# Patient Record
Sex: Female | Born: 1965 | Race: White | Hispanic: No | Marital: Married | Smoking: Never smoker
Health system: Southern US, Community
[De-identification: ages and names within clinical notes are randomized; demographics above are authoritative.]

## PROBLEM LIST (undated history)

## (undated) DIAGNOSIS — Z87898 Personal history of other specified conditions: Secondary | ICD-10-CM

## (undated) DIAGNOSIS — E079 Disorder of thyroid, unspecified: Secondary | ICD-10-CM

## (undated) DIAGNOSIS — Z9289 Personal history of other medical treatment: Secondary | ICD-10-CM

## (undated) HISTORY — PX: WISDOM TOOTH EXTRACTION: SHX21

## (undated) HISTORY — DX: Personal history of other medical treatment: Z92.89

## (undated) HISTORY — DX: Personal history of other specified conditions: Z87.898

## (undated) HISTORY — DX: Disorder of thyroid, unspecified: E07.9

## (undated) HISTORY — PX: COLONOSCOPY: SHX174

---

## 1993-09-22 HISTORY — PX: DIAGNOSTIC LAPAROSCOPY: SUR761

## 2006-06-21 ENCOUNTER — Inpatient Hospital Stay: Payer: Self-pay | Admitting: Obstetrics and Gynecology

## 2016-03-07 ENCOUNTER — Other Ambulatory Visit: Payer: Self-pay | Admitting: Certified Nurse Midwife

## 2016-03-07 DIAGNOSIS — Z1231 Encounter for screening mammogram for malignant neoplasm of breast: Secondary | ICD-10-CM

## 2016-03-26 ENCOUNTER — Ambulatory Visit: Payer: Self-pay

## 2016-06-12 ENCOUNTER — Ambulatory Visit: Payer: Self-pay

## 2016-06-13 ENCOUNTER — Ambulatory Visit
Admission: RE | Admit: 2016-06-13 | Discharge: 2016-06-13 | Disposition: A | Discharge status: Home / Self Care | Payer: BLUE CROSS/BLUE SHIELD | Source: Ambulatory Visit | Attending: Certified Nurse Midwife | Admitting: Certified Nurse Midwife

## 2016-06-13 ENCOUNTER — Encounter (HOSPITAL_COMMUNITY): Payer: Self-pay

## 2016-06-13 DIAGNOSIS — Z1231 Encounter for screening mammogram for malignant neoplasm of breast: Secondary | ICD-10-CM | POA: Diagnosis present

## 2017-03-26 ENCOUNTER — Encounter: Payer: Self-pay | Admitting: Certified Nurse Midwife

## 2017-03-26 ENCOUNTER — Ambulatory Visit (INDEPENDENT_AMBULATORY_CARE_PROVIDER_SITE_OTHER): Payer: BLUE CROSS/BLUE SHIELD | Admitting: Certified Nurse Midwife

## 2017-03-26 VITALS — BP 90/50 | HR 51 | Ht 61.0 in | Wt 128.0 lb

## 2017-03-26 DIAGNOSIS — Z1239 Encounter for other screening for malignant neoplasm of breast: Secondary | ICD-10-CM

## 2017-03-26 DIAGNOSIS — Z01419 Encounter for gynecological examination (general) (routine) without abnormal findings: Secondary | ICD-10-CM | POA: Diagnosis not present

## 2017-03-26 DIAGNOSIS — Z1231 Encounter for screening mammogram for malignant neoplasm of breast: Secondary | ICD-10-CM | POA: Diagnosis not present

## 2017-03-26 DIAGNOSIS — E039 Hypothyroidism, unspecified: Secondary | ICD-10-CM | POA: Insufficient documentation

## 2017-03-26 NOTE — Progress Notes (Signed)
Gynecology Annual Exam  PCP: Patient, No Pcp Per  Chief Complaint:  Chief Complaint  Patient presents with  . Gynecologic Exam    History of Present Illness:Bonnie Anderson is a 51 year old Caucasian/White female, G3 P3003, who presents for her annual exam. She is having no significant GYN problems.  Her menses are absent since June 2014. Has no current problems with vaginal dryness.  She has had no spotting.   The patient's past medical history is notable for a history of hypothyroidism and is currently taking 141mcg/day. She is followed by DR Ouida Sills.  Since her last annual GYN exam dated 03/20/2016, she has had no significant changes in her health history.  She is sexually active. She is currently using condoms for contraception. Was advised she no longer needs to use contraception.  Her most recent pap smear was obtained 03/20/2016 and was  negative   Her most recent mammogram obtained on 06/13/2016 was normal.  She has an appointment July 2017 with GI regarding colonoscopy. There is no family history of breast cancer.  There is no family history of ovarian cancer.  The patient does do monthly self breast exams.  The patient does not smoke.  The patient does drink infrequently.  The patient does not use illegal drugs.  The patient exercises regularly.  The patient does get adequate calcium in her diet. She is also taking vitamin D supplements. Her Dexa scan in 2015 she reports as being normal.  She had a recent cholesterol screen in 2018 that was normal.      The patient denies current symptoms of depression.    Review of Systems: Review of Systems  Constitutional: Negative for chills, fever and weight loss.  HENT: Negative for congestion, sinus pain and sore throat.   Eyes: Negative for blurred vision and pain.  Respiratory: Negative for hemoptysis, shortness of breath and wheezing.   Cardiovascular: Negative for chest pain, palpitations and leg swelling.    Gastrointestinal: Negative for abdominal pain, blood in stool, diarrhea, heartburn, nausea and vomiting.  Genitourinary: Negative for dysuria, frequency, hematuria and urgency.  Musculoskeletal: Negative for back pain, joint pain and myalgias.  Skin: Negative for itching and rash.  Neurological: Negative for dizziness, tingling and headaches.  Endo/Heme/Allergies: Negative for environmental allergies and polydipsia. Does not bruise/bleed easily.       Negative for hirsutism   Psychiatric/Behavioral: Negative for depression. The patient is not nervous/anxious and does not have insomnia.     Past Medical History:  Past Medical History:  Diagnosis Date  . Thyroid disease     Past Surgical History:  Past Surgical History:  Procedure Laterality Date  . DIAGNOSTIC LAPAROSCOPY     [redacted] weeks gestation with adnexal mass/ MAD/ uterine leiomyoma  . WISDOM TOOTH EXTRACTION  age 44   one tooth    Family History:  Family History  Problem Relation Age of Onset  . Thyroid disease Mother        hypothyroidism  . Thyroid disease Sister        hypothyroidism  . Breast cancer Neg Hx   . Diabetes Neg Hx   . Heart failure Neg Hx     Social History:  Social History   Social History  . Marital status: Married    Spouse name: N/A  . Number of children: 3  . Years of education: N/A   Occupational History  . homemaker    Social History Main Topics  . Smoking status:  Never Smoker  . Smokeless tobacco: Never Used  . Alcohol use No  . Drug use: No  . Sexual activity: Yes    Partners: Male    Birth control/ protection: Post-menopausal   Other Topics Concern  . Not on file   Social History Narrative  . No narrative on file    Allergies:  No Known Allergies  Medications:  Current Outpatient Prescriptions:  .  aspirin 81 MG chewable tablet, Chew 81 mg by mouth daily., Disp: , Rfl:  .  cholecalciferol (VITAMIN D) 400 units TABS tablet, Take 400 Units by mouth daily., Disp: ,  Rfl:  .  fluticasone (FLONASE) 50 MCG/ACT nasal spray, Place 2 sprays into both nostrils daily as needed for allergies or rhinitis., Disp: , Rfl:  .  levothyroxine (SYNTHROID, LEVOTHROID) 75 MCG tablet, Take by mouth., Disp: , Rfl:  .  Multiple Vitamin (MULTIVITAMIN) tablet, Take 1 tablet by mouth daily., Disp: , Rfl:  .  Omega 3 1000 MG CAPS, Take 1 capsule by mouth daily., Disp: , Rfl:   Physical Exam Vitals: BP (!) 90/50   Pulse (!) 51   Ht 5\' 1"  (1.549 m)   Wt 128 lb (58.1 kg)   BMI 24.19 kg/m   General:WF in  NAD HEENT: normocephalic, anicteric Neck: no thyroid enlargement, no palpable nodules, no cervical lymphadenopathy  Pulmonary: No increased work of breathing, CTAB Cardiovascular: RRR, without murmur  Breast: Breast symmetrical, no tenderness, no palpable nodules or masses, no skin or nipple retraction present, no nipple discharge.  No axillary, infraclavicular or supraclavicular lymphadenopathy. Abdomen: Soft, non-tender, non-distended.  Umbilicus without lesions.  No hepatomegaly or masses palpable. No evidence of hernia. Genitourinary:  External: Normal external female genitalia.  Normal urethral meatus, normal Bartholin's and Skene's glands.    Vagina: Normal vaginal mucosa, cystocele present    Cervix: Grossly normal in appearance, no bleeding, non-tender  Uterus: Retroflexed, normal size, shape, and consistency, mobile, and non-tender  Adnexa: No adnexal masses, non-tender  Rectal: deferred  Lymphatic: no evidence of inguinal lymphadenopathy Extremities: no edema, erythema, or tenderness Neurologic: Grossly intact Psychiatric: mood appropriate, affect full     Assessment: 51 y.o. [id number] well woman exam  Plan:   1) Breast cancer screening - recommend monthly self breast exam and annual mammograms Mammogram was ordered today. Patient to schedule after 22 Sept 2) Cervical cancer screening - Pap smear due in 2 years. ASCCP guidelines and rational discussed.   Patient opts for every 3 years screening interval 3) Routine healthcare maintenance including cholesterol and diabetes screening managed by PCP  4) Colon cancer screening- colonoscopy to be done this year. Has appt with GI to schedule  Dalia Heading, CNM

## 2017-06-25 HISTORY — PX: COLONOSCOPY W/ BIOPSIES AND POLYPECTOMY: SHX1376

## 2017-07-01 ENCOUNTER — Ambulatory Visit
Admission: RE | Admit: 2017-07-01 | Discharge: 2017-07-01 | Disposition: A | Discharge status: Home / Self Care | Payer: BLUE CROSS/BLUE SHIELD | Source: Ambulatory Visit | Attending: Certified Nurse Midwife | Admitting: Certified Nurse Midwife

## 2017-07-01 DIAGNOSIS — Z1231 Encounter for screening mammogram for malignant neoplasm of breast: Secondary | ICD-10-CM | POA: Insufficient documentation

## 2017-07-01 DIAGNOSIS — Z1239 Encounter for other screening for malignant neoplasm of breast: Secondary | ICD-10-CM

## 2018-04-19 ENCOUNTER — Encounter: Payer: Self-pay | Admitting: Certified Nurse Midwife

## 2018-04-19 ENCOUNTER — Ambulatory Visit (INDEPENDENT_AMBULATORY_CARE_PROVIDER_SITE_OTHER): Payer: BLUE CROSS/BLUE SHIELD | Admitting: Certified Nurse Midwife

## 2018-04-19 VITALS — BP 102/62 | HR 55 | Ht 61.0 in | Wt 142.0 lb

## 2018-04-19 DIAGNOSIS — Z1231 Encounter for screening mammogram for malignant neoplasm of breast: Secondary | ICD-10-CM | POA: Diagnosis not present

## 2018-04-19 DIAGNOSIS — Z01419 Encounter for gynecological examination (general) (routine) without abnormal findings: Secondary | ICD-10-CM

## 2018-04-19 DIAGNOSIS — Z1239 Encounter for other screening for malignant neoplasm of breast: Secondary | ICD-10-CM

## 2018-04-19 NOTE — Progress Notes (Signed)
Gynecology Annual Exam  PCP: Patient, No Pcp Per  Chief Complaint:  Chief Complaint  Patient presents with  . Gynecologic Exam    History of Present Illness:Bonnie Anderson is a 52 year old Caucasian/White female, G3 P3003, who presents for her annual exam. She is having no significant GYN problems.  Her menses are absent since June 2014. Has no current problems with vaginal dryness.  She has had no spotting.   The patient's past medical history is notable for a history of hypothyroidism and is currently taking 75 mcg/day. She is followed by DR Ouida Sills.  Since her last annual GYN exam dated 04/05/2017, she has had no significant changes in her health history.  She is sexually active. She is currently using condoms for contraception. She is aware she no longer needs contraception. Her most recent pap smear was obtained 03/20/2016 and was  Negative. She desires Pap smears every 3 years  Her most recent mammogram obtained on 07/01/2017 was normal.  She had a colonoscopy 06/25/2017 and had an adenomatous polyp removed. Repeat in 06/2018 to make sure all of the polyp was removed.. There is no family history of breast cancer.  There is no family history of ovarian cancer.  The patient does do monthly self breast exams.  The patient does not smoke.  The patient does drink infrequently.  The patient does not use illegal drugs.  The patient exercises regularly but has gained 14# since her last visit. Has been eating more CHO recently. The patient does get adequate calcium in her diet and with her supplement. Her Dexa scan in 2015 she reports as being normal.  She had a recent cholesterol screen in 2019 that was normal.      The patient denies current symptoms of depression.    Review of Systems: Review of Systems  Constitutional: Negative for chills, fever and weight loss.  HENT: Negative for congestion, sinus pain and sore throat.   Eyes: Negative for blurred vision and pain.    Respiratory: Negative for hemoptysis, shortness of breath and wheezing.   Cardiovascular: Negative for chest pain, palpitations and leg swelling.  Gastrointestinal: Negative for abdominal pain, blood in stool, diarrhea, heartburn, nausea and vomiting.  Genitourinary: Negative for dysuria, frequency, hematuria and urgency.  Musculoskeletal: Negative for back pain, joint pain and myalgias.  Skin: Negative for itching and rash.  Neurological: Negative for dizziness, tingling and headaches.  Endo/Heme/Allergies: Negative for environmental allergies and polydipsia. Does not bruise/bleed easily.       Negative for hirsutism   Psychiatric/Behavioral: Negative for depression. The patient is not nervous/anxious and does not have insomnia.     Past Medical History:  Past Medical History:  Diagnosis Date  . History of abnormal mammogram 02/01/14; 06/13/16   NEG  . History of Papanicolaou smear of cervix 02/01/14; 03/20/16   -/-; NEG  . Thyroid disease    hypothyroidism; DR. MARK ANDERSON    Past Surgical History:  Past Surgical History:  Procedure Laterality Date  . DIAGNOSTIC LAPAROSCOPY     [redacted] weeks gestation with adnexal mass/ MAD/ uterine leiomyoma  . WISDOM TOOTH EXTRACTION  age 3   one tooth    Family History:  Family History  Problem Relation Age of Onset  . Thyroid disease Mother        hypothyroidism  . Thyroid disease Sister        hypothyroidism  . Breast cancer Neg Hx   . Diabetes Neg Hx   .  Heart failure Neg Hx     Social History:  Social History   Socioeconomic History  . Marital status: Married    Spouse name: Wes  . Number of children: 3  . Years of education: 40  . Highest education level: Not on file  Occupational History  . Occupation: homemaker  Social Needs  . Financial resource strain: Not on file  . Food insecurity:    Worry: Not on file    Inability: Not on file  . Transportation needs:    Medical: Not on file    Non-medical: Not on file   Tobacco Use  . Smoking status: Never Smoker  . Smokeless tobacco: Never Used  Substance and Sexual Activity  . Alcohol use: Yes  . Drug use: No  . Sexual activity: Yes    Partners: Male    Birth control/protection: Post-menopausal  Lifestyle  . Physical activity:    Days per week: Not on file    Minutes per session: Not on file  . Stress: Not on file  Relationships  . Social connections:    Talks on phone: Not on file    Gets together: Not on file    Attends religious service: Not on file    Active member of club or organization: Not on file    Attends meetings of clubs or organizations: Not on file    Relationship status: Not on file  . Intimate partner violence:    Fear of current or ex partner: Not on file    Emotionally abused: Not on file    Physically abused: Not on file    Forced sexual activity: Not on file  Other Topics Concern  . Not on file  Social History Narrative  . Not on file    Allergies:  No Known Allergies  Medications:  Current Outpatient Medications on File Prior to Visit  Medication Sig Dispense Refill  . calcium-vitamin D (OSCAL WITH D) 500-200 MG-UNIT TABS tablet Take by mouth.    . fluticasone (FLONASE) 50 MCG/ACT nasal spray Place 2 sprays into both nostrils daily as needed for allergies or rhinitis.    Marland Kitchen levothyroxine (SYNTHROID, LEVOTHROID) 75 MCG tablet Take 75 mcg by mouth daily before breakfast.     .       No current facility-administered medications on file prior to visit.   And Omega 3 capsules occasionally    Physical Exam Vitals: BP 102/62   Pulse (!) 55   Ht 5\' 1"  (1.549 m)   Wt 142 lb (64.4 kg)   BMI 26.83 kg/m   General:WF in  NAD HEENT: normocephalic, anicteric Neck: no thyroid enlargement, no palpable nodules, no cervical lymphadenopathy  Pulmonary: No increased work of breathing, CTAB Cardiovascular: RRR, without murmur  Breast: Breast symmetrical, no tenderness, no palpable nodules or masses, no skin or nipple  retraction present, no nipple discharge.  No axillary, infraclavicular or supraclavicular lymphadenopathy. Abdomen: Soft, non-tender, non-distended.  Umbilicus without lesions.  No hepatomegaly or masses palpable. No evidence of hernia. Genitourinary:  External: Normal external female genitalia.  Normal urethral meatus, normal Bartholin's and Skene's glands.    Vagina: Normal vaginal mucosa, cystocele present    Cervix: Grossly normal in appearance, no bleeding, non-tender  Uterus: Retroflexed, normal size, shape, and consistency, mobile, and non-tender  Adnexa: No adnexal masses, non-tender  Rectal: deferred  Lymphatic: no evidence of inguinal lymphadenopathy Extremities: no edema, erythema, or tenderness Neurologic: Grossly intact Psychiatric: mood appropriate, affect full     Assessment:  52 y.o. [id number] well woman exam  Plan:   1) Breast cancer screening - recommend monthly self breast exam and annual mammograms Mammogram was ordered today. Patient to schedule after 07/01/18 2) Cervical cancer screening - Pap smear due in 1 year. ASCCP guidelines and rational discussed.  Patient opts for every 3 years screening interval 3) Routine healthcare maintenance including cholesterol and diabetes screening managed by PCP  4) Colon cancer screening- follow up colonoscopy to be done this year. GI to schedule in October 5) RTO 1 year and prn  Dalia Heading, CNM

## 2018-07-05 ENCOUNTER — Ambulatory Visit
Admission: RE | Admit: 2018-07-05 | Discharge: 2018-07-05 | Disposition: A | Discharge status: Home / Self Care | Payer: BLUE CROSS/BLUE SHIELD | Source: Ambulatory Visit | Attending: Certified Nurse Midwife | Admitting: Certified Nurse Midwife

## 2018-07-05 DIAGNOSIS — Z1239 Encounter for other screening for malignant neoplasm of breast: Secondary | ICD-10-CM | POA: Diagnosis present

## 2019-04-27 ENCOUNTER — Ambulatory Visit: Payer: BLUE CROSS/BLUE SHIELD | Admitting: Certified Nurse Midwife

## 2019-05-11 ENCOUNTER — Other Ambulatory Visit: Payer: Self-pay

## 2019-05-11 ENCOUNTER — Encounter: Payer: Self-pay | Admitting: Certified Nurse Midwife

## 2019-05-11 ENCOUNTER — Ambulatory Visit (INDEPENDENT_AMBULATORY_CARE_PROVIDER_SITE_OTHER): Payer: BC Managed Care – PPO | Admitting: Certified Nurse Midwife

## 2019-05-11 ENCOUNTER — Other Ambulatory Visit (HOSPITAL_COMMUNITY)
Admission: RE | Admit: 2019-05-11 | Discharge: 2019-05-11 | Disposition: A | Discharge status: Home / Self Care | Payer: BC Managed Care – PPO | Source: Ambulatory Visit | Attending: Certified Nurse Midwife | Admitting: Certified Nurse Midwife

## 2019-05-11 VITALS — BP 100/70 | HR 65 | Ht 61.0 in | Wt 149.2 lb

## 2019-05-11 DIAGNOSIS — Z124 Encounter for screening for malignant neoplasm of cervix: Secondary | ICD-10-CM | POA: Insufficient documentation

## 2019-05-11 DIAGNOSIS — Z1239 Encounter for other screening for malignant neoplasm of breast: Secondary | ICD-10-CM

## 2019-05-11 DIAGNOSIS — Z01419 Encounter for gynecological examination (general) (routine) without abnormal findings: Secondary | ICD-10-CM | POA: Insufficient documentation

## 2019-05-11 DIAGNOSIS — E2839 Other primary ovarian failure: Secondary | ICD-10-CM

## 2019-05-11 DIAGNOSIS — D369 Benign neoplasm, unspecified site: Secondary | ICD-10-CM | POA: Insufficient documentation

## 2019-05-11 DIAGNOSIS — Z13820 Encounter for screening for osteoporosis: Secondary | ICD-10-CM

## 2019-05-11 NOTE — Progress Notes (Signed)
Gynecology Annual Exam  PCP: Kirk Ruths, MD  Chief Complaint:  Chief Complaint  Patient presents with  . Gynecologic Exam    History of Present Illness:Bonnie Anderson is a 53 year old Caucasian/White female, G3 P3003, who presents for her annual exam. She is having no significant GYN problems.  Her menses are absent since June 2014. Has no current problems with vaginal dryness.  She has had no spotting.   The patient's past medical history is notable for a history of hypothyroidism and is currently taking 75 mcg/day. She is followed by DR Ouida Sills.  Since her last annual GYN exam dated 04/19/2018, she had a pinched nerve in her back/spine in March. The pain has resolved with chiropractic treatment. Has gained another 7# since her last annual. Expresses frustration with lack of weight loss.  She is sexually active. She denies any dyspareunia. Her most recent pap smear was obtained 03/20/2016 and was  Negative. She desires Pap smears every 3 years  Her most recent mammogram obtained on 07/05/2018 was normal.  She had a colonoscopy 06/25/2017 and had an adenomatous polyp removed. Repeat colonoscopy 08/12/2018 was normal. Next Pap smear due in 5 years. There is no family history of breast cancer.  There is no family history of ovarian cancer.  The patient does do monthly self breast exams.  The patient does not smoke.  The patient does drink infrequently.  The patient does not use illegal drugs.  The patient normally exercises regularly, but had a pinched nerve causing pain in her right scapular area. Is just getting back to exercising since the problem has resolved. The patient does get adequate calcium in her diet and with her supplement. Her Dexa scan in 2015 she reports as being normal.  She had a recent cholesterol screen in 2020 by PCP that was normal.      The patient denies current symptoms of depression.    Review of Systems: Review of Systems  Constitutional:  Negative for chills, fever and weight loss.  HENT: Negative for congestion, sinus pain and sore throat.   Eyes: Negative for blurred vision and pain.  Respiratory: Negative for hemoptysis, shortness of breath and wheezing.   Cardiovascular: Negative for chest pain, palpitations and leg swelling.  Gastrointestinal: Negative for abdominal pain, blood in stool, diarrhea, heartburn, nausea and vomiting.  Genitourinary: Negative for dysuria, frequency, hematuria and urgency.  Musculoskeletal: Negative for back pain, joint pain and myalgias.  Skin: Negative for itching and rash.  Neurological: Negative for dizziness, tingling and headaches.  Endo/Heme/Allergies: Positive for environmental allergies. Negative for polydipsia. Does not bruise/bleed easily.       Negative for hirsutism   Psychiatric/Behavioral: Negative for depression. The patient is not nervous/anxious and does not have insomnia.     Past Medical History:  Past Medical History:  Diagnosis Date  . History of abnormal mammogram 02/01/14; 06/13/16   NEG  . History of Papanicolaou smear of cervix 02/01/14; 03/20/16   -/-; NEG  . Thyroid disease    hypothyroidism; DR. MARK ANDERSON    Past Surgical History:  Past Surgical History:  Procedure Laterality Date  . COLONOSCOPY  08/13/2019   normal  . COLONOSCOPY W/ BIOPSIES AND POLYPECTOMY  06/25/2017   adenomatous polyp removed  . DIAGNOSTIC LAPAROSCOPY  1995   [redacted] weeks gestation with adnexal mass/ MAD/ uterine leiomyoma  . WISDOM TOOTH EXTRACTION  age 12   one tooth    Family History:  Family History  Problem Relation Age of Onset  . Thyroid disease Mother        hypothyroidism  . Thyroid disease Sister        hypothyroidism  . Breast cancer Neg Hx   . Diabetes Neg Hx   . Heart failure Neg Hx     Social History:  Social History   Socioeconomic History  . Marital status: Married    Spouse name: Wes  . Number of children: 3  . Years of education: 38  . Highest  education level: Not on file  Occupational History  . Occupation: homemaker  Social Needs  . Financial resource strain: Not on file  . Food insecurity    Worry: Not on file    Inability: Not on file  . Transportation needs    Medical: Not on file    Non-medical: Not on file  Tobacco Use  . Smoking status: Never Smoker  . Smokeless tobacco: Never Used  Substance and Sexual Activity  . Alcohol use: Yes  . Drug use: No  . Sexual activity: Yes    Partners: Male    Birth control/protection: Post-menopausal  Lifestyle  . Physical activity    Days per week: Not on file    Minutes per session: Not on file  . Stress: Not on file  Relationships  . Social Herbalist on phone: Not on file    Gets together: Not on file    Attends religious service: Not on file    Active member of club or organization: Not on file    Attends meetings of clubs or organizations: Not on file    Relationship status: Not on file  . Intimate partner violence    Fear of current or ex partner: Not on file    Emotionally abused: Not on file    Physically abused: Not on file    Forced sexual activity: Not on file  Other Topics Concern  . Not on file  Social History Narrative  . Not on file    Allergies:  No Known Allergies  Medications:  Current Outpatient Medications on File Prior to Visit  Medication Sig Dispense Refill  . aspirin EC 81 MG tablet Take 1 tablet by mouth 3 times/day as needed-between meals & bedtime. TWICE A WEEK    . cholecalciferol (VITAMIN D) 400 units TABS tablet Take 400 Units by mouth daily.    Marland Kitchen Fexofenadine-Pseudoephedrine (ALLEGRA-D 24 HOUR PO) Take 1 tablet by mouth daily.    . fluticasone (FLONASE) 50 MCG/ACT nasal spray Place 2 sprays into both nostrils daily as needed for allergies or rhinitis.    Marland Kitchen levothyroxine (SYNTHROID, LEVOTHROID) 75 MCG tablet Take 75 mcg by mouth daily before breakfast.     . Multiple Vitamins-Minerals (AIRBORNE PO) Take by mouth.      No current facility-administered medications on file prior to visit.    Physical Exam Vitals: BP 100/70   Pulse 65   Ht 5\' 1"  (1.549 m)   Wt 149 lb 3.2 oz (67.7 kg)   LMP  (LMP Unknown)   BMI 28.19 kg/m   General:WF in  NAD HEENT: normocephalic, anicteric Neck: no thyroid enlargement, no palpable nodules, no cervical lymphadenopathy  Pulmonary: No increased work of breathing, CTAB Cardiovascular: RRR, without murmur  Breast: Breast symmetrical, no tenderness, no palpable nodules or masses, no skin or nipple retraction present, no nipple discharge.  No axillary, infraclavicular or supraclavicular lymphadenopathy. Abdomen: Soft, non-tender, non-distended.  Umbilicus without lesions.  No hepatomegaly or masses palpable. No evidence of hernia. Genitourinary:  External: Normal external female genitalia.  Normal urethral meatus, normal Bartholin's and Skene's glands.    Vagina: Normal vaginal mucosa, cystocele present    Cervix: Grossly normal in appearance, no bleeding, non-tender  Uterus: Retroverted, normal size, shape, and consistency, mobile, and non-tender  Adnexa: No adnexal masses, non-tender  Rectal: deferred  Lymphatic: no evidence of inguinal lymphadenopathy Extremities: no edema, erythema, or tenderness Neurologic: Grossly intact Psychiatric: mood appropriate, affect full     Assessment: 53 y.o. G3P3003 normal well woman exam  Plan:   1) Breast cancer screening - recommend monthly self breast exam and annual mammograms Mammogram was ordered today. Patient to schedule after 07/06/19 2) Cervical cancer screening - Pap done. ASCCP guidelines and rational discussed.  Patient opts for every 3 years screening interval 3) Routine healthcare maintenance including cholesterol and diabetes screening managed by PCP  4) Colon cancer screening- colonoscopy negative last year. Next one due in 2024 5) Osteoporosis prevention-discussed calcium and vitamin D3 requirements and role of  exercise in preventing osteoporosis. Dexa scan ordered 6) RTO 1 year and prn  Dalia Heading, CNM

## 2019-05-12 ENCOUNTER — Telehealth: Payer: Self-pay | Admitting: Certified Nurse Midwife

## 2019-05-12 LAB — CYTOLOGY - PAP
Diagnosis: NEGATIVE
HPV: NOT DETECTED

## 2019-05-12 NOTE — Telephone Encounter (Signed)
Patient aware of appoiontment for her Dexa and mammogram. She is scheduled for 07/07/19 at 9:40am.

## 2019-05-12 NOTE — Telephone Encounter (Signed)
-----   Message from [place] sent at 05/11/2019  3:36 PM EDT ----- Regarding: FW: DEXA appointment Will you please schedule the patient's appointment? Thank you. ----- Message ----- From: Dalia Heading, CNM Sent: 05/11/2019  11:07 AM EDT To: [place] Lodge Subject: DEXA appointment                               Please schedule DEXA scan for after 07/06/2019 at [facility]. Thanks, [employer]

## 2019-05-16 ENCOUNTER — Encounter: Payer: Self-pay | Admitting: Certified Nurse Midwife

## 2019-07-07 ENCOUNTER — Ambulatory Visit
Admission: RE | Admit: 2019-07-07 | Discharge: 2019-07-07 | Disposition: A | Discharge status: Home / Self Care | Payer: BC Managed Care – PPO | Source: Ambulatory Visit | Attending: Certified Nurse Midwife | Admitting: Certified Nurse Midwife

## 2019-07-07 DIAGNOSIS — M85851 Other specified disorders of bone density and structure, right thigh: Secondary | ICD-10-CM | POA: Diagnosis not present

## 2019-07-07 DIAGNOSIS — Z13820 Encounter for screening for osteoporosis: Secondary | ICD-10-CM | POA: Insufficient documentation

## 2019-07-07 DIAGNOSIS — Z1231 Encounter for screening mammogram for malignant neoplasm of breast: Secondary | ICD-10-CM | POA: Insufficient documentation

## 2019-07-07 DIAGNOSIS — Z1239 Encounter for other screening for malignant neoplasm of breast: Secondary | ICD-10-CM

## 2019-07-07 DIAGNOSIS — E2839 Other primary ovarian failure: Secondary | ICD-10-CM

## 2019-11-11 ENCOUNTER — Encounter: Payer: Self-pay | Admitting: Certified Nurse Midwife

## 2019-11-11 ENCOUNTER — Telehealth: Payer: Self-pay

## 2019-11-11 DIAGNOSIS — M8580 Other specified disorders of bone density and structure, unspecified site: Secondary | ICD-10-CM | POA: Insufficient documentation

## 2019-11-11 NOTE — Telephone Encounter (Signed)
Pt calling triage to speak with CLG , has questions about a mammogram order and having some issues, would like to speak with clg today.

## 2019-11-11 NOTE — Telephone Encounter (Signed)
Spoke with patient regarding coding for mammogram. Dx code got changed from Z12.39 to Z12.31. Will check with Izora Gala to see if that can be rectified.

## 2019-11-14 NOTE — Telephone Encounter (Signed)
Patient called and questions answered.

## 2019-11-14 NOTE — Telephone Encounter (Signed)
Patient is wanting a call back from [place]. Please advise

## 2019-11-16 NOTE — Telephone Encounter (Signed)
Called patient regarding dx code for DEXA scan. This was a screening DEXA and her insurance requires the code to be a screening code rather than a diagnosis like I used of Estrogen Deficiency. Will see if I can change code.

## 2019-11-16 NOTE — Telephone Encounter (Signed)
Patient is calling to speak with Jaclyn Shaggy about needing to change the Bone density code. Please advise . Patient is aware CLG is on Call today

## 2020-10-16 ENCOUNTER — Other Ambulatory Visit: Payer: Self-pay | Admitting: Internal Medicine

## 2020-10-16 DIAGNOSIS — Z1231 Encounter for screening mammogram for malignant neoplasm of breast: Secondary | ICD-10-CM

## 2020-12-27 ENCOUNTER — Ambulatory Visit
Admission: RE | Admit: 2020-12-27 | Discharge: 2020-12-27 | Disposition: A | Discharge status: Home / Self Care | Payer: BC Managed Care – PPO | Source: Ambulatory Visit | Attending: Internal Medicine | Admitting: Internal Medicine

## 2020-12-27 ENCOUNTER — Other Ambulatory Visit: Payer: Self-pay

## 2020-12-27 DIAGNOSIS — Z1231 Encounter for screening mammogram for malignant neoplasm of breast: Secondary | ICD-10-CM | POA: Insufficient documentation

## 2021-12-17 ENCOUNTER — Other Ambulatory Visit: Payer: Self-pay | Admitting: Internal Medicine

## 2021-12-17 DIAGNOSIS — Z1231 Encounter for screening mammogram for malignant neoplasm of breast: Secondary | ICD-10-CM

## 2022-01-22 ENCOUNTER — Ambulatory Visit
Admission: RE | Admit: 2022-01-22 | Discharge: 2022-01-22 | Disposition: A | Discharge status: Home / Self Care | Payer: BC Managed Care – PPO | Source: Ambulatory Visit | Attending: Internal Medicine | Admitting: Internal Medicine

## 2022-01-22 DIAGNOSIS — Z1231 Encounter for screening mammogram for malignant neoplasm of breast: Secondary | ICD-10-CM | POA: Insufficient documentation

## 2022-12-30 ENCOUNTER — Other Ambulatory Visit: Payer: Self-pay | Admitting: Internal Medicine

## 2022-12-30 DIAGNOSIS — Z1231 Encounter for screening mammogram for malignant neoplasm of breast: Secondary | ICD-10-CM

## 2023-02-04 ENCOUNTER — Ambulatory Visit
Admission: RE | Admit: 2023-02-04 | Discharge: 2023-02-04 | Disposition: A | Discharge status: Home / Self Care | Payer: BC Managed Care – PPO | Source: Ambulatory Visit | Attending: Internal Medicine | Admitting: Internal Medicine

## 2023-02-04 DIAGNOSIS — Z1231 Encounter for screening mammogram for malignant neoplasm of breast: Secondary | ICD-10-CM | POA: Insufficient documentation

## 2024-04-27 ENCOUNTER — Other Ambulatory Visit: Payer: Self-pay | Admitting: Internal Medicine

## 2024-04-27 DIAGNOSIS — Z1231 Encounter for screening mammogram for malignant neoplasm of breast: Secondary | ICD-10-CM

## 2024-05-01 IMAGING — MG MM DIGITAL SCREENING BILAT W/ TOMO AND CAD
8 series · 9 of 24 positions shown · non-contrast
Comparison: Previous exam(s).

CLINICAL DATA: Screening.

EXAM:
DIGITAL SCREENING BILATERAL MAMMOGRAM WITH TOMOSYNTHESIS AND CAD
TECHNIQUE: Bilateral screening digital craniocaudal and mediolateral oblique
mammograms were obtained. Bilateral screening digital breast
tomosynthesis was performed. The images were evaluated with
computer-aided detection.

[R MLO synth-2D]
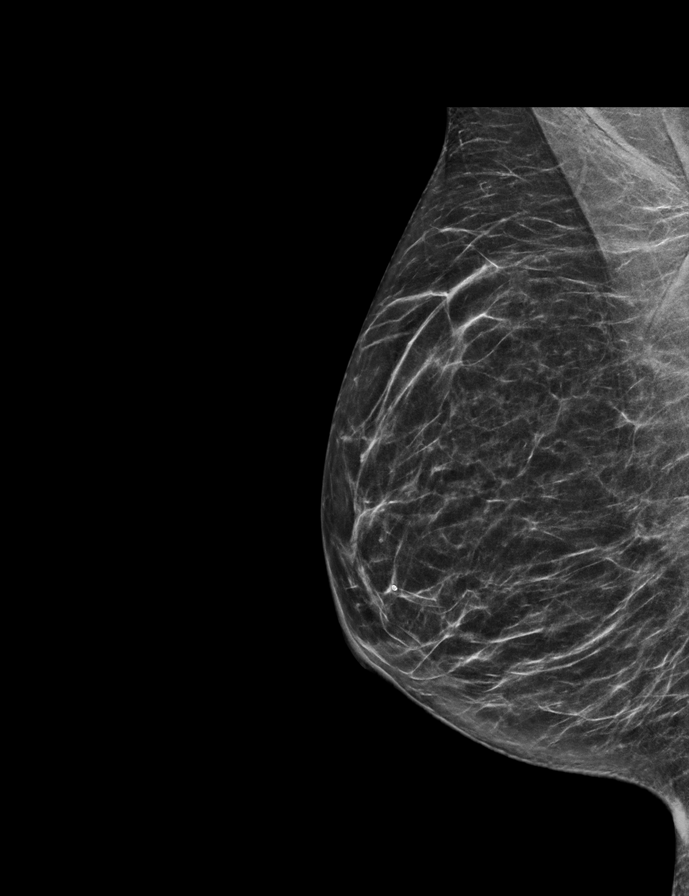

[R CC synth-2D]
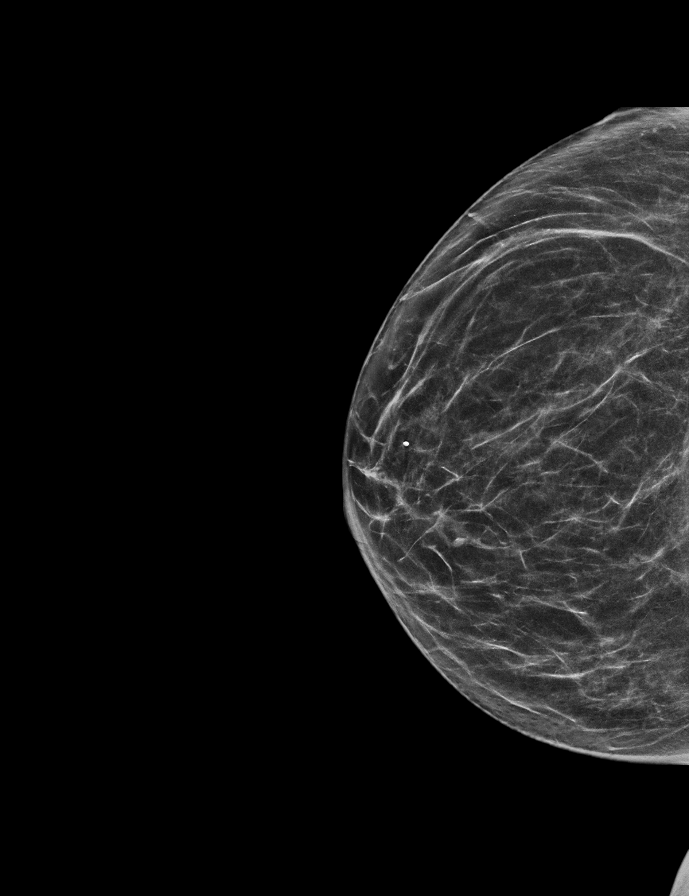

[L CC synth-2D]
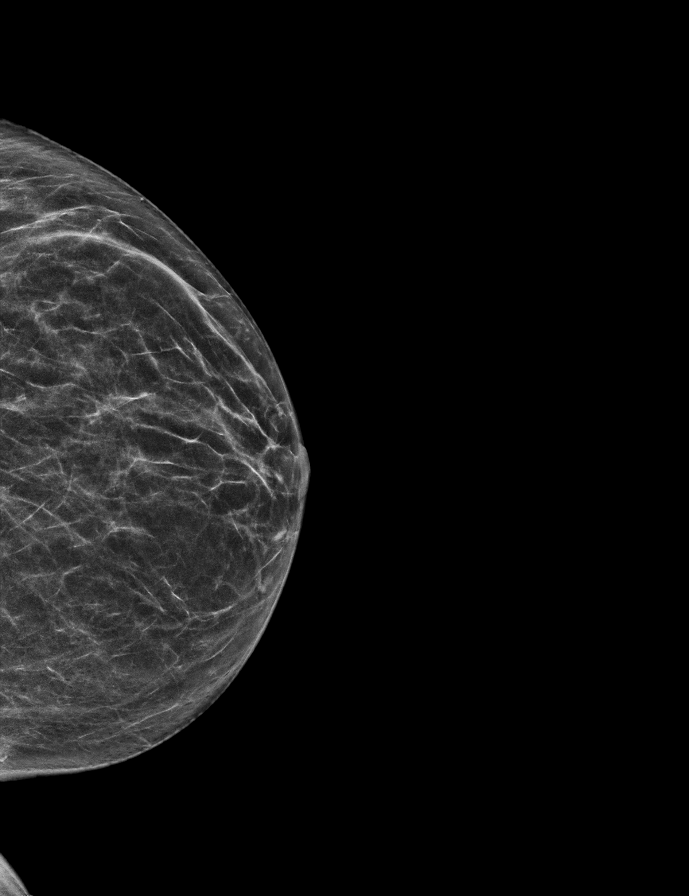

[L MLO synth-2D]
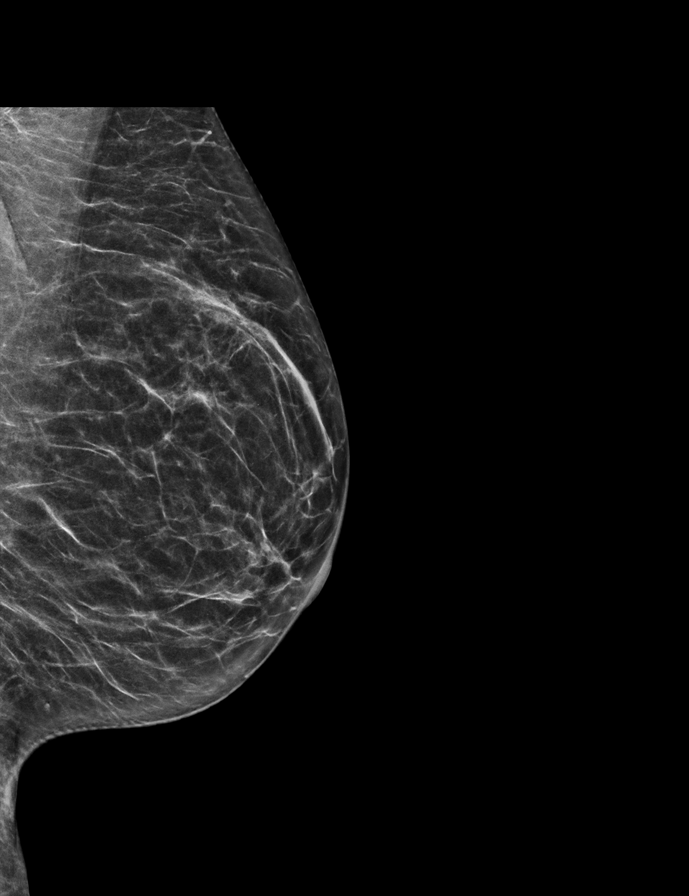

[L MLO tomo · 2 of 54 frames shown]
[frame 18/54]
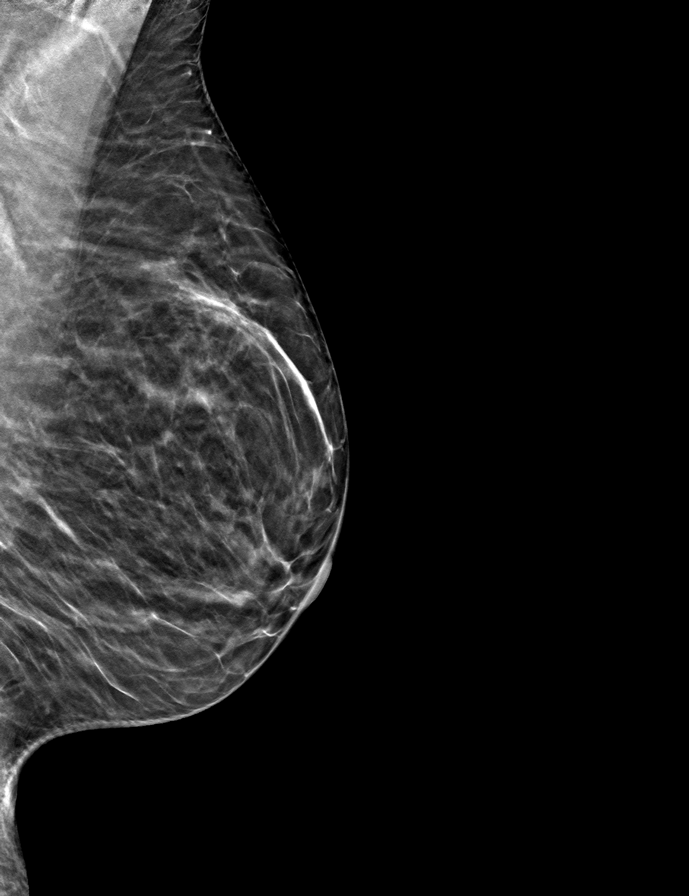
[frame 27/54]
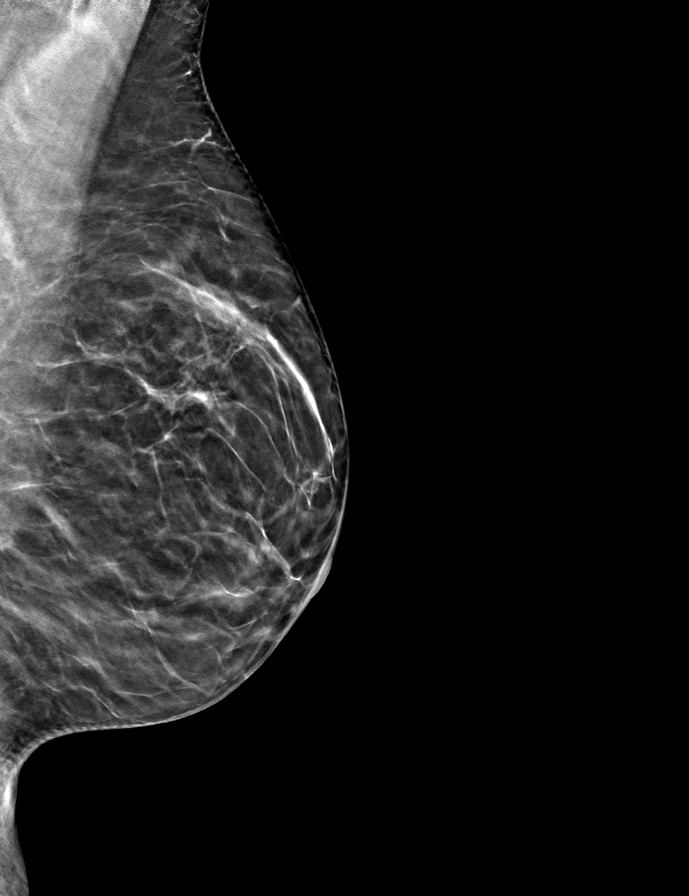

[R CC tomo · tomo slice 29/57.0]
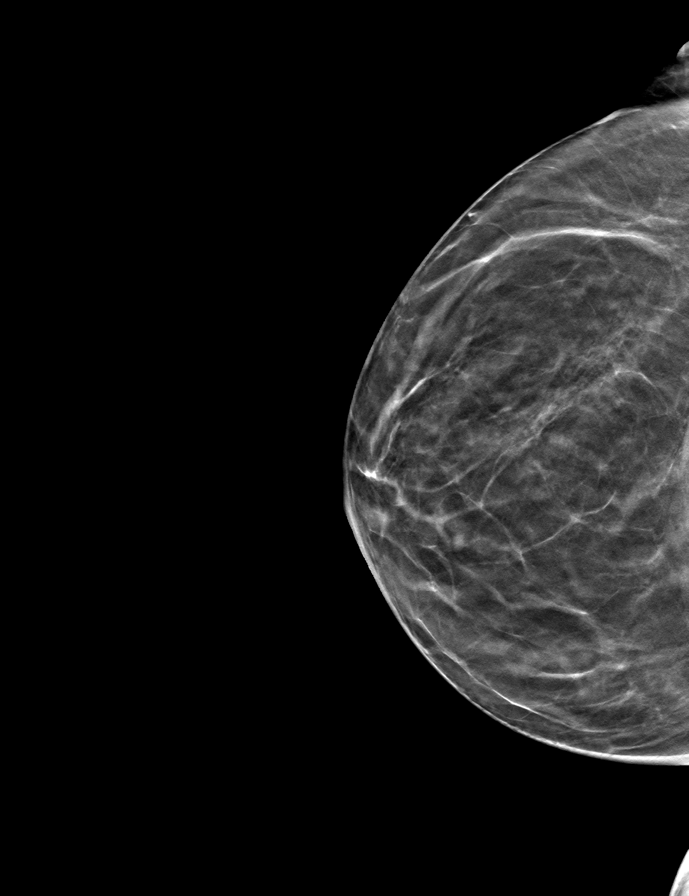

[L CC tomo · tomo slice 26/51.0]
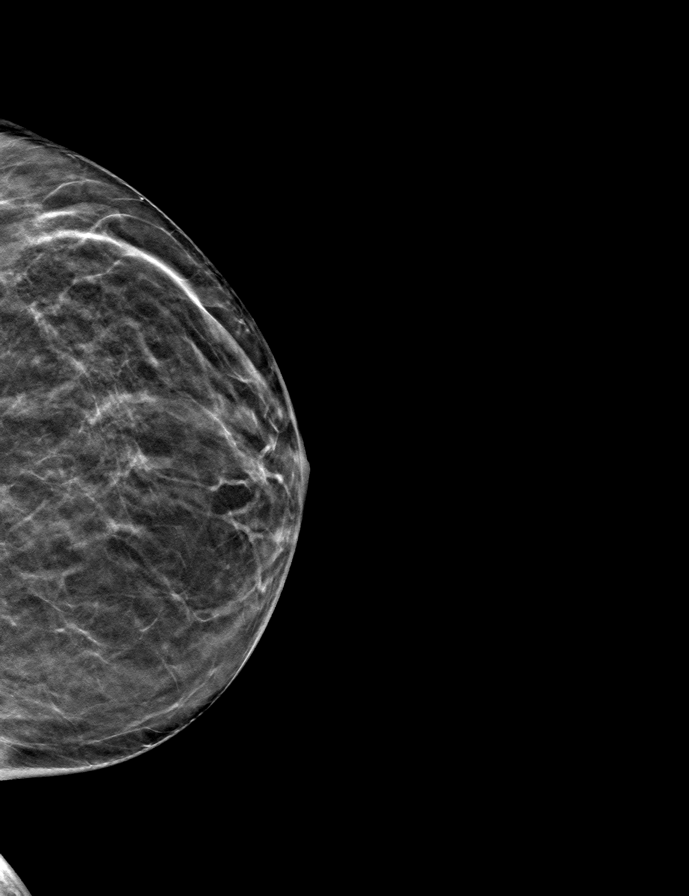

[R MLO tomo · tomo slice 29/57.0]
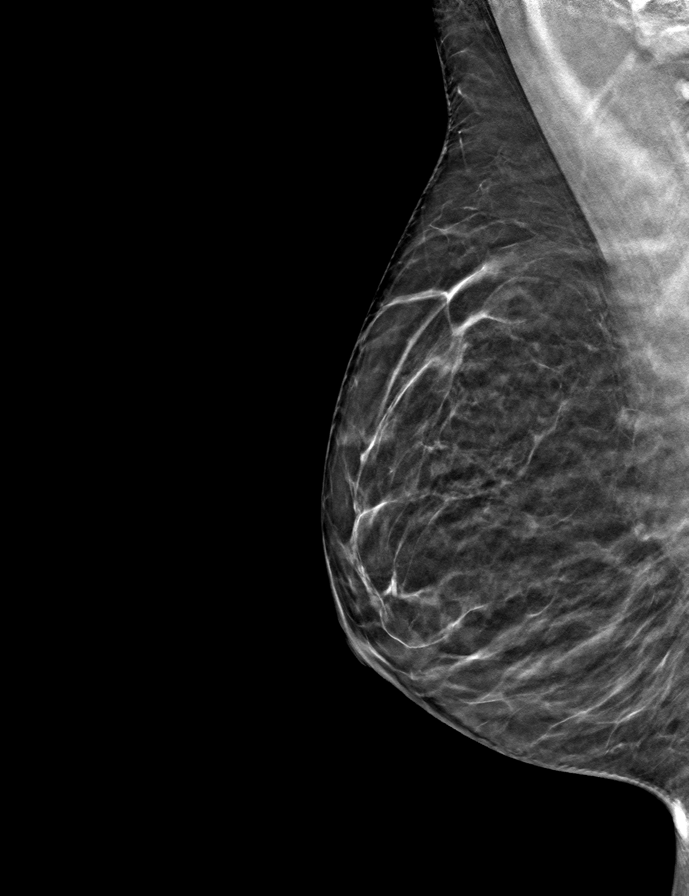

[9 of 24 positions shown; findings below may reference images not displayed]

ACR Breast Density Category b: There are scattered areas of
fibroglandular density.
FINDINGS: There are no findings suspicious for malignancy.
IMPRESSION: No mammographic evidence of malignancy. A result letter of this
screening mammogram will be mailed directly to the patient.

RECOMMENDATION:
Screening mammogram in one year. (Code:51-O-LD2)

BI-RADS CATEGORY  1: Negative.

## 2024-05-18 ENCOUNTER — Ambulatory Visit
Admission: RE | Admit: 2024-05-18 | Discharge: 2024-05-18 | Disposition: A | Discharge status: Home / Self Care | Source: Ambulatory Visit | Attending: Internal Medicine | Admitting: Internal Medicine

## 2024-05-18 DIAGNOSIS — Z1231 Encounter for screening mammogram for malignant neoplasm of breast: Secondary | ICD-10-CM | POA: Diagnosis present

## 2024-06-17 ENCOUNTER — Other Ambulatory Visit: Payer: Self-pay | Admitting: Sports Medicine

## 2024-06-17 DIAGNOSIS — M1612 Unilateral primary osteoarthritis, left hip: Secondary | ICD-10-CM

## 2024-06-17 DIAGNOSIS — G8929 Other chronic pain: Secondary | ICD-10-CM

## 2024-06-17 DIAGNOSIS — M25552 Pain in left hip: Secondary | ICD-10-CM

## 2024-06-17 DIAGNOSIS — M7062 Trochanteric bursitis, left hip: Secondary | ICD-10-CM

## 2024-06-26 NOTE — Progress Notes (Unsigned)
 PCP: Lenon Layman ORN, MD   No chief complaint on file.   HPI:      [name] is a 58 y.o. [phone] whose LMP was No LMP recorded (lmp unknown). Patient is postmenopausal., presents today for her NP>3 yrs annual examination.  Her menses are {norm/abn:715}, lasting {number: 22536} days.  Dysmenorrhea {dysmen:716}. She {does:18564} have intermenstrual bleeding. She {does:18564} have vasomotor sx.   Sex activity: {sex active: 315163}. She {does:18564} have vaginal dryness/pain/bleeding.  Last Pap: 05/11/19 Results were: no abnormalities /neg HPV DNA.  Hx of STDs: {STD hx:14358}  Last mammogram: 05/18/24 Results were: normal--routine follow-up in 12 months There is no FH of breast cancer. There is no FH of ovarian cancer. The patient {does:18564} do self-breast exams.  Colonoscopy: 2018 Repeat due after 10*** years.   Tobacco use: {tob:20664} Alcohol use: {Alcohol:11675} No drug use Exercise: {exercise:31265}  She {does:18564} get adequate calcium and Vitamin D in her diet.  Labs with PCP.   Patient Active Problem List   Diagnosis Date Noted   Osteopenia after menopause 11/11/2019   Adenomatous polyp 05/11/2019   Hypothyroidism (acquired) 03/26/2017    Past Surgical History:  Procedure Laterality Date   COLONOSCOPY  08/13/2019   normal   COLONOSCOPY W/ BIOPSIES AND POLYPECTOMY  06/25/2017   adenomatous polyp removed   DIAGNOSTIC LAPAROSCOPY  1995   [redacted] weeks gestation with adnexal mass/ MAD/ uterine leiomyoma   WISDOM TOOTH EXTRACTION  age 89   one tooth    Family History  Problem Relation Age of Onset   Thyroid disease Mother        hypothyroidism   Thyroid disease Sister        hypothyroidism   Breast cancer Neg Hx    Diabetes Neg Hx    Heart failure Neg Hx     Social History   Socioeconomic History   Marital status: Married    Spouse name: Wes   Number of children: 3   Years of education: 16   Highest education level: Not on file   Occupational History   Occupation: homemaker  Tobacco Use   Smoking status: Never   Smokeless tobacco: Never  Vaping Use   Vaping status: Never Used  Substance and Sexual Activity   Alcohol use: Yes   Drug use: No   Sexual activity: Yes    Partners: Male    Birth control/protection: Post-menopausal  Other Topics Concern   Not on file  Social History Narrative   Not on file   Social Drivers of Health   Financial Resource Strain: Low Risk  (02/01/2024)   Received from [facility]   Overall Financial Resource Strain (CARDIA)    Difficulty of Paying Living Expenses: Not hard at all  Food Insecurity: No Food Insecurity (02/01/2024)   Received from [facility]   Hunger Vital Sign    Within the past 12 months, you worried that your food would run out before you got the money to buy more.: Never true    Within the past 12 months, the food you bought just didn't last and you didn't have money to get more.: Never true  Transportation Needs: No Transportation Needs (02/01/2024)   Received from [facility]    In the past 12 months, has lack of transportation kept you from medical appointments or from getting medications?: No    Lack of Transportation (Non-Medical): No  Physical Activity: Not on  file  Stress: Not on file  Social Connections: Not on file  Intimate Partner Violence: Not on file     Current Outpatient Medications:    aspirin EC 81 MG tablet, Take 1 tablet by mouth 3 times/day as needed-between meals & bedtime. TWICE A WEEK, Disp: , Rfl:    cholecalciferol (VITAMIN D) 400 units TABS tablet, Take 400 Units by mouth daily., Disp: , Rfl:    Fexofenadine-Pseudoephedrine (ALLEGRA-D 24 HOUR PO), Take 1 tablet by mouth daily., Disp: , Rfl:    fluticasone (FLONASE) 50 MCG/ACT nasal spray, Place 2 sprays into both nostrils daily as needed for allergies or rhinitis., Disp: , Rfl:    levothyroxine  (SYNTHROID, LEVOTHROID) 75 MCG tablet, Take 75 mcg by mouth daily before breakfast. , Disp: , Rfl:    Multiple Vitamins-Minerals (AIRBORNE PO), Take by mouth., Disp: , Rfl:      ROS:  Review of Systems BREAST: No symptoms    Objective: LMP  (LMP Unknown)    OBGyn Exam  Results: No results found for this or any previous visit (from the past 24 hours).  Assessment/Plan:  No diagnosis found.   No orders of the defined types were placed in this encounter.           GYN counsel {counseling: 16159}    F/U  No follow-ups on file.  Lakenya Riendeau B. Zebedee Segundo, PA-C 06/26/2024 9:54 AM

## 2024-06-27 ENCOUNTER — Encounter: Payer: Self-pay | Admitting: Obstetrics and Gynecology

## 2024-06-27 ENCOUNTER — Ambulatory Visit (INDEPENDENT_AMBULATORY_CARE_PROVIDER_SITE_OTHER): Admitting: Obstetrics and Gynecology

## 2024-06-27 ENCOUNTER — Ambulatory Visit
Admission: RE | Admit: 2024-06-27 | Discharge: 2024-06-27 | Disposition: A | Discharge status: Home / Self Care | Source: Ambulatory Visit | Attending: Sports Medicine | Admitting: Sports Medicine

## 2024-06-27 ENCOUNTER — Other Ambulatory Visit (HOSPITAL_COMMUNITY)
Admission: RE | Admit: 2024-06-27 | Discharge: 2024-06-27 | Disposition: A | Source: Ambulatory Visit | Attending: Obstetrics and Gynecology | Admitting: Obstetrics and Gynecology

## 2024-06-27 VITALS — BP 102/67 | HR 69 | Ht 61.0 in | Wt 148.0 lb

## 2024-06-27 DIAGNOSIS — R1032 Left lower quadrant pain: Secondary | ICD-10-CM | POA: Diagnosis not present

## 2024-06-27 DIAGNOSIS — M7062 Trochanteric bursitis, left hip: Secondary | ICD-10-CM | POA: Diagnosis present

## 2024-06-27 DIAGNOSIS — Z124 Encounter for screening for malignant neoplasm of cervix: Secondary | ICD-10-CM

## 2024-06-27 DIAGNOSIS — Z1151 Encounter for screening for human papillomavirus (HPV): Secondary | ICD-10-CM | POA: Diagnosis not present

## 2024-06-27 DIAGNOSIS — M5442 Lumbago with sciatica, left side: Secondary | ICD-10-CM | POA: Diagnosis present

## 2024-06-27 DIAGNOSIS — G8929 Other chronic pain: Secondary | ICD-10-CM | POA: Diagnosis present

## 2024-06-27 DIAGNOSIS — Z1231 Encounter for screening mammogram for malignant neoplasm of breast: Secondary | ICD-10-CM

## 2024-06-27 DIAGNOSIS — Z1211 Encounter for screening for malignant neoplasm of colon: Secondary | ICD-10-CM

## 2024-06-27 DIAGNOSIS — N812 Incomplete uterovaginal prolapse: Secondary | ICD-10-CM

## 2024-06-27 DIAGNOSIS — Z01411 Encounter for gynecological examination (general) (routine) with abnormal findings: Secondary | ICD-10-CM

## 2024-06-27 DIAGNOSIS — M25552 Pain in left hip: Secondary | ICD-10-CM | POA: Diagnosis present

## 2024-06-27 DIAGNOSIS — Z01419 Encounter for gynecological examination (general) (routine) without abnormal findings: Secondary | ICD-10-CM

## 2024-06-27 DIAGNOSIS — M1612 Unilateral primary osteoarthritis, left hip: Secondary | ICD-10-CM | POA: Insufficient documentation

## 2024-06-27 NOTE — Patient Instructions (Signed)
 I value your feedback and you entrusting [place] with your care. If you get a [place] patient survey, I would appreciate you taking the time to let us know about your experience today. Thank you! ? ? ?

## 2024-06-29 LAB — CYTOLOGY - PAP
Comment: NEGATIVE
Diagnosis: NEGATIVE
High risk HPV: NEGATIVE

## 2024-07-04 ENCOUNTER — Telehealth: Payer: Self-pay | Admitting: Obstetrics and Gynecology

## 2024-07-04 DIAGNOSIS — N9489 Other specified conditions associated with female genital organs and menstrual cycle: Secondary | ICD-10-CM

## 2024-07-04 NOTE — Telephone Encounter (Addendum)
 Saw pt 06/27/24 for annual. Having LT hip/groin pain with difficulty walking, and low back pain. Had MRI LT hip but was trying to get pelvis too. Results came back after appt and show LT adnexal lesion and probable peritoneal cyst. Will check GYN u/s, order placed. Pt to call Centralized scheduling at [facility] [phone], option #3. Will f/u with results.

## 2024-07-11 ENCOUNTER — Ambulatory Visit
Admission: RE | Admit: 2024-07-11 | Discharge: 2024-07-11 | Disposition: A | Discharge status: Home / Self Care | Source: Ambulatory Visit | Attending: Obstetrics and Gynecology | Admitting: Obstetrics and Gynecology

## 2024-07-11 DIAGNOSIS — N9489 Other specified conditions associated with female genital organs and menstrual cycle: Secondary | ICD-10-CM | POA: Diagnosis present

## 2024-07-13 NOTE — Addendum Note (Signed)
 Addended by: WATT HILA B on: 07/13/2024 04:27 PM   Modules accepted: Orders

## 2024-07-13 NOTE — Telephone Encounter (Signed)
 LM for pt re: GYN u/s results. LT adnexal area was neg (having LT sided hip pain); RT adnexal area with large cyst. Discussed with Dr. Starla. Ca-125 ordered, pt to f/u with her to discuss lap cystectomy vs expectant mgmt.

## 2024-07-17 ENCOUNTER — Encounter: Payer: Self-pay | Admitting: Obstetrics and Gynecology

## 2024-07-18 ENCOUNTER — Ambulatory Visit: Payer: Self-pay | Admitting: Obstetrics and Gynecology

## 2024-07-18 NOTE — Progress Notes (Signed)
 Pt needs RTO cyst eval sooner rather than later, per Dr. Starla. Pls call to get her scheduled. If over 3 wks out, pls let me know. Thx.

## 2024-07-21 ENCOUNTER — Other Ambulatory Visit

## 2024-07-21 DIAGNOSIS — N9489 Other specified conditions associated with female genital organs and menstrual cycle: Secondary | ICD-10-CM

## 2024-07-22 LAB — CA 125: Cancer Antigen (CA) 125: 26.3 U/mL (ref 0.0–38.1)

## 2024-08-30 ENCOUNTER — Encounter: Payer: Self-pay | Admitting: Obstetrics & Gynecology

## 2024-08-30 ENCOUNTER — Ambulatory Visit: Admitting: Obstetrics & Gynecology

## 2024-08-30 VITALS — BP 117/72 | HR 77 | Ht 61.0 in | Wt 150.0 lb

## 2024-08-30 DIAGNOSIS — N83201 Unspecified ovarian cyst, right side: Secondary | ICD-10-CM

## 2024-08-30 NOTE — Progress Notes (Signed)
    GYNECOLOGY PROGRESS NOTE  Subjective:    Patient ID: Bonnie Anderson, female    DOB: 12/06/1965, 58 y.o.   MRN: [id number]  HPI  Patient is a 58 y.o. married [id number] (29, 77, and41 yo sons) here to discuss her MRI and pelvic ultrasound results. She initially had a left hip MRI without contrast on 06/27/2024. It showed a pelvic mass. A pelvic ultrasound was done on 07/11/2024. It showed the following: RIGHT OVARY: Cystic right adnexal process 6.4 x 5.1 x 5.4 cm with some curvilinear internal septations. According to the Ovarian-Adnexal Reporting and Data System Ultrasound (O-RADS US ), the finding is consistent with O-RADS US  3 (low risk of malignancy, 1% to <10%) and the recommendation is referral to gynecologist, consider dedicated MRI. The right ovary is not separately visualized.   LEFT OVARY: The left ovary is not visualized.    She has had a cortisone injection by her ortho and had some relief but she still has LBP.  The following portions of the patient's history were reviewed and updated as appropriate: allergies, current medications, past family history, past medical history, past social history, past surgical history, and problem list.  Review of Systems Pertinent items are noted in HPI.  Pap and mammogram are UTD. No FH of breast, gyn, colon cancers.  Objective:   Blood pressure 117/72, pulse 77, height 5' 1 (1.549 m), weight 150 lb (68 kg). Body mass index is 28.34 kg/m. Well nourished, well hydrated White female, no apparent distress She is ambulating and conversing normally. Exam deferred   Assessment:   1. Right ovarian cyst    - CA 125 was 26 recently. - I spoke with Dr. Aaron benedict, who did recommend a dedicated pelvic MRI with and without contrast to clarify the septations.  2.   LBP- I sent her ortho, Dr. Sharrie, a message to see if he can coordinate a possible MRI of her spine. Plan:   1. Right ovarian cyst (Primary)  - MR PELVIS W WO  CONTRAST; Future  We have discussed that if the Pelvic MRI shows the same findings as the left hip MRI, then she will have to decide if she wants removal of ehr adnexa versus watchful waiting with serial ultrasounds/CA 125.  After further discussion, she prefers to avoid the newest MRI and go directly to removing her ovaries.

## 2024-09-27 ENCOUNTER — Other Ambulatory Visit: Payer: Self-pay | Admitting: Obstetrics & Gynecology

## 2024-09-27 ENCOUNTER — Telehealth: Payer: Self-pay

## 2024-09-27 DIAGNOSIS — N83201 Unspecified ovarian cyst, right side: Secondary | ICD-10-CM

## 2024-09-27 NOTE — Progress Notes (Signed)
MRI with and without contrast reordered.

## 2024-09-27 NOTE — Telephone Encounter (Signed)
 Patient transferred by front desk inquiring about prior request. Advised per referral for MRI done on 08/30/24 that Arlisha sent pt my chart message today advising: Sent patient a friendly reminder via mychart with contact information for scheduling pelvic MRI.  Patient will check her my chart and reach out for scheduling.

## 2024-09-27 NOTE — Telephone Encounter (Signed)
 Patient called requesting an order for an MRI from Dr. Starla as discussed at her visit in December.

## 2024-09-30 ENCOUNTER — Other Ambulatory Visit: Payer: Self-pay | Admitting: Family Medicine

## 2024-09-30 DIAGNOSIS — M5416 Radiculopathy, lumbar region: Secondary | ICD-10-CM

## 2024-10-20 ENCOUNTER — Ambulatory Visit
Admission: RE | Admit: 2024-10-20 | Discharge: 2024-10-20 | Disposition: A | Discharge status: Home / Self Care | Source: Ambulatory Visit | Attending: Obstetrics & Gynecology | Admitting: Obstetrics & Gynecology

## 2024-10-20 ENCOUNTER — Ambulatory Visit
Admission: RE | Admit: 2024-10-20 | Discharge: 2024-10-20 | Disposition: A | Discharge status: Home / Self Care | Source: Ambulatory Visit | Attending: Family Medicine | Admitting: Family Medicine

## 2024-10-20 DIAGNOSIS — M5416 Radiculopathy, lumbar region: Secondary | ICD-10-CM

## 2024-10-20 DIAGNOSIS — N83201 Unspecified ovarian cyst, right side: Secondary | ICD-10-CM

## 2024-10-20 MED ORDER — GADOPICLENOL 0.5 MMOL/ML IV SOLN
7.5000 mL | Freq: Once | INTRAVENOUS | Status: AC | PRN
  Administered 2024-10-20: 7.5 mL via INTRAVENOUS

## 2024-10-26 ENCOUNTER — Ambulatory Visit: Payer: Self-pay | Admitting: Obstetrics & Gynecology

## 2024-10-26 ENCOUNTER — Other Ambulatory Visit: Payer: Self-pay | Admitting: Obstetrics & Gynecology

## 2024-10-26 DIAGNOSIS — K668 Other specified disorders of peritoneum: Secondary | ICD-10-CM

## 2024-10-26 NOTE — Progress Notes (Signed)
 Follow up pelvic ultrasound ordered for peritoneal cyst Mychart message sent

## 2024-10-28 ENCOUNTER — Other Ambulatory Visit: Payer: Self-pay | Admitting: Physical Medicine and Rehabilitation

## 2024-10-28 DIAGNOSIS — N281 Cyst of kidney, acquired: Secondary | ICD-10-CM

## 2024-11-08 ENCOUNTER — Other Ambulatory Visit
